# Patient Record
Sex: Female | Born: 1975 | Race: Black or African American | Hispanic: No | Marital: Married | Smoking: Never smoker
Health system: Southern US, Community
[De-identification: ages and names within clinical notes are randomized; demographics above are authoritative.]

## PROBLEM LIST (undated history)

## (undated) DIAGNOSIS — T7840XA Allergy, unspecified, initial encounter: Secondary | ICD-10-CM

## (undated) HISTORY — DX: Allergy, unspecified, initial encounter: T78.40XA

---

## 2010-02-21 ENCOUNTER — Emergency Department: Payer: Self-pay | Admitting: Emergency Medicine
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
## 2014-03-25 ENCOUNTER — Ambulatory Visit (INDEPENDENT_AMBULATORY_CARE_PROVIDER_SITE_OTHER): Payer: BC Managed Care – PPO

## 2014-03-25 ENCOUNTER — Ambulatory Visit (INDEPENDENT_AMBULATORY_CARE_PROVIDER_SITE_OTHER): Payer: BC Managed Care – PPO | Admitting: Podiatry

## 2014-03-25 ENCOUNTER — Other Ambulatory Visit: Payer: Self-pay | Admitting: *Deleted

## 2014-03-25 ENCOUNTER — Encounter: Payer: Self-pay | Admitting: Podiatry

## 2014-03-25 VITALS — BP 111/78 | HR 92 | Resp 16 | Ht 64.0 in | Wt 175.0 lb

## 2014-03-25 DIAGNOSIS — M722 Plantar fascial fibromatosis: Secondary | ICD-10-CM | POA: Diagnosis not present

## 2014-03-25 MED ORDER — METHYLPREDNISOLONE (PAK) 4 MG PO TABS: ORAL_TABLET | ORAL | Status: DC

## 2014-03-25 MED ORDER — MELOXICAM 15 MG PO TABS: 15.0000 mg | ORAL_TABLET | Freq: Every day | ORAL | Status: DC

## 2014-03-25 NOTE — Progress Notes (Signed)
   Subjective:    Patient ID: Shannon Pena, female    DOB: July 25, 1976, 38 y.o.   MRN: [id number]  HPI Comments: Both feet hurt, the right foot hurts on the side and the top of foot, the left foot the whole bottom of the foot hurts. As soon as my feet touch the ground in the mornings it is the worse. After being on them all day at work it hurts.   Foot Pain      Review of Systems  All other systems reviewed and are negative.      Objective:   Physical Exam: I have reviewed her past medical history medications allergies surgeries social history and review of systems. Pulses are strongly palpable bilateral. Neurologic sensorium is intact per Semmes-Weinstein monofilament. Deep tendon reflexes are intact bilateral muscle strength is 5 over 5 dorsiflexors plantar flexors inverters everters all intrinsic musculature is intact. Orthopedic evaluation demonstrates pain on palpation medial continued tubercles of the bilateral foot and pain on palpation of the lateral limb of the fifth metatarsal area right foot. Radiographic evaluation demonstrates mild pes planus bilateral with soft tissue increase in density at the plantar fascial calcaneal insertion sites bilaterally.        Assessment & Plan:  Assessment: Pes planus with plantar fasciitis right greater than left.  Plan: Injected the bilateral heels today with Kenalog and local anesthetic. Started her on Medrol Dosepak to be followed by meloxicam. Plantar fascial brace was dispensed for bilateral foot. And a plantar fascial night splint for the right foot. We discussed appropriate shoe gear stretching exercises ice therapy shoe gear modifications. We discussed the etiology pathology conservative versus surgical therapies. I will followup with her in one month should she have questions or concerns she will notify us immediately.

## 2014-03-27 ENCOUNTER — Telehealth: Payer: Self-pay | Admitting: *Deleted

## 2014-03-27 NOTE — Telephone Encounter (Signed)
Message copied by Bing ReeGUNN, Shacara Cozine L on Wed Mar 27, 2014  4:24 PM ------      Message from: Pleas PatriciaURANT, ROCHELLE L      Created: Wed Mar 27, 2014  3:43 PM      Regarding: nurse call      Contact: [phone]       She is having heartburn from Medrol dospack, she would like to know if its okay to take her heartburn medicine. ------

## 2014-03-27 NOTE — Telephone Encounter (Signed)
Pt called left message stating that dr Al Corpushyatt had put her on a prednisone, and was two days into taking it and wanted to know if her toenails being sore was a side effect. Spoke with dr Al Corpushyatt and stated that he had never heard of that . Called patient back and left her a message to call us back

## 2014-03-27 NOTE — Telephone Encounter (Signed)
Yes you can take take your heartburn medicine

## 2014-04-23 ENCOUNTER — Telehealth: Payer: Self-pay | Admitting: Podiatry

## 2014-04-24 ENCOUNTER — Ambulatory Visit: Payer: BC Managed Care – PPO | Admitting: Podiatry

## 2014-04-26 DIAGNOSIS — E785 Hyperlipidemia, unspecified: Secondary | ICD-10-CM | POA: Insufficient documentation

## 2014-05-23 ENCOUNTER — Ambulatory Visit: Payer: Self-pay | Admitting: Family Medicine

## 2016-11-16 ENCOUNTER — Encounter: Payer: Self-pay | Admitting: Podiatry

## 2016-11-16 ENCOUNTER — Ambulatory Visit (INDEPENDENT_AMBULATORY_CARE_PROVIDER_SITE_OTHER): Payer: BC Managed Care – PPO | Admitting: Podiatry

## 2016-11-16 ENCOUNTER — Ambulatory Visit (INDEPENDENT_AMBULATORY_CARE_PROVIDER_SITE_OTHER): Payer: BC Managed Care – PPO

## 2016-11-16 DIAGNOSIS — M79671 Pain in right foot: Secondary | ICD-10-CM

## 2016-11-16 DIAGNOSIS — M722 Plantar fascial fibromatosis: Secondary | ICD-10-CM | POA: Diagnosis not present

## 2016-11-16 DIAGNOSIS — M79672 Pain in left foot: Secondary | ICD-10-CM | POA: Diagnosis not present

## 2016-11-16 DIAGNOSIS — M79673 Pain in unspecified foot: Secondary | ICD-10-CM | POA: Diagnosis not present

## 2016-11-18 MED ORDER — IBUPROFEN-FAMOTIDINE 800-26.6 MG PO TABS: ORAL_TABLET | ORAL | 2 refills | Status: AC

## 2016-12-01 MED ORDER — BETAMETHASONE SOD PHOS & ACET 6 (3-3) MG/ML IJ SUSP: 3.0000 mg | Freq: Once | INTRAMUSCULAR | Status: AC

## 2016-12-01 NOTE — Progress Notes (Signed)
   Subjective: Patient presents today for pain and tenderness in the feet bilaterally. Patient states the foot pain has been hurting for approximately 1 year now. Patient states that the right foot hurts worse than the left. She states is aching at night. Patient has used Motrin with slight relief.  Objective: Physical Exam General: The patient is alert and oriented x3 in no acute distress.  Dermatology: Skin is warm, dry and supple bilateral lower extremities. Negative for open lesions or macerations bilateral.   Vascular: Dorsalis Pedis and Posterior Tibial pulses palpable bilateral.  Capillary fill time is immediate to all digits.  Neurological: Epicritic and protective threshold intact bilateral.   Musculoskeletal: Tenderness to palpation at the medial calcaneal tubercale and through the insertion of the plantar fascia of the bilateral feet. All other joints range of motion within normal limits bilateral. Strength 5/5 in all groups bilateral.   Radiographic exam: Normal osseous mineralization. Joint spaces preserved. No fracture/dislocation/boney destruction. Calcaneal spur present with mild thickening of plantar fascia bilateral. No other soft tissue abnormalities or radiopaque foreign bodies.   Assessment: #1 plantar fasciitis bilateral feet #2 pain in bilateral feet  Plan of Care:  1. Patient evaluated. Xrays reviewed.   2. Injection of 0.5cc Celestone soluspan injected into the left heel 3. Instructed patient regarding therapies and modalities at home to alleviate symptoms.  4. Prescription for Duexis 5. Plantar fascial band(s) dispensed for bilateral plantar fasciitis. 6. Return to clinic in 4 weeks.    Felecia ShellingBrent M. Evans, DPM [facility]  Dr. Felecia ShellingBrent M. Evans, DPM    [address]                                        [place], [place] [address]                Office [phone]  Fax [phone]

## 2016-12-17 ENCOUNTER — Ambulatory Visit: Payer: BC Managed Care – PPO | Admitting: Podiatry

## 2016-12-31 ENCOUNTER — Ambulatory Visit (INDEPENDENT_AMBULATORY_CARE_PROVIDER_SITE_OTHER): Payer: BC Managed Care – PPO | Admitting: Podiatry

## 2016-12-31 ENCOUNTER — Encounter: Payer: Self-pay | Admitting: Podiatry

## 2016-12-31 DIAGNOSIS — M79671 Pain in right foot: Secondary | ICD-10-CM

## 2016-12-31 DIAGNOSIS — M79673 Pain in unspecified foot: Secondary | ICD-10-CM

## 2016-12-31 DIAGNOSIS — M722 Plantar fascial fibromatosis: Secondary | ICD-10-CM | POA: Diagnosis not present

## 2016-12-31 DIAGNOSIS — M79672 Pain in left foot: Secondary | ICD-10-CM

## 2016-12-31 MED ORDER — NONFORMULARY OR COMPOUNDED ITEM: 2 refills | Status: AC

## 2017-01-02 NOTE — Progress Notes (Signed)
   Subjective: Patient presents today for follow up for pain and tenderness in the feet bilaterally. She states her pain is unchanged and reports significant pain, especially while at work. She states the injection did not help and the Duexis is not providing any relief either. She states she does not want another injection.  Objective: Physical Exam General: The patient is alert and oriented x3 in no acute distress.  Dermatology: Skin is warm, dry and supple bilateral lower extremities. Negative for open lesions or macerations bilateral.   Vascular: Dorsalis Pedis and Posterior Tibial pulses palpable bilateral.  Capillary fill time is immediate to all digits.  Neurological: Epicritic and protective threshold intact bilateral.   Musculoskeletal: Tenderness to palpation at the medial calcaneal tubercale and through the insertion of the plantar fascia of the bilateral feet. All other joints range of motion within normal limits bilateral. Strength 5/5 in all groups bilateral.   Assessment: #1 plantar fasciitis bilateral feet #2 pain in bilateral feet  Plan of Care:  1. Patient evaluated.  2. Prescription for antiinflammatory pain cream to be dispensed from [facility]. 3. Order for physical therapy three times weekly for four weeks given. 4. Continue wearing plantar fascial braces 5. Continue Duexis  6. Patient cannot take oral Prednisone 7. Return to clinic in 4 weeks.   Felecia Shelling, DPM [facility]  Dr. Felecia Shelling, DPM    [address]                                        [place], [place] [address]                Office [phone]  Fax [phone]

## 2017-02-01 ENCOUNTER — Ambulatory Visit (INDEPENDENT_AMBULATORY_CARE_PROVIDER_SITE_OTHER): Payer: BC Managed Care – PPO | Admitting: Podiatry

## 2017-02-01 DIAGNOSIS — M722 Plantar fascial fibromatosis: Secondary | ICD-10-CM

## 2017-02-01 NOTE — Progress Notes (Signed)
   Subjective: Patient presents today for follow up for pain and tenderness in the feet bilaterally. She states her pain is a little better. She states physical therapy is helping. She reports a sore, throbbing spot on the left medial heel. She has been wearing the plantar fasciitis braces which also helped to alleviate her pain.  Objective: Physical Exam General: The patient is alert and oriented x3 in no acute distress.  Dermatology: Skin is warm, dry and supple bilateral lower extremities. Negative for open lesions or macerations bilateral.   Vascular: Dorsalis Pedis and Posterior Tibial pulses palpable bilateral.  Capillary fill time is immediate to all digits.  Neurological: Epicritic and protective threshold intact bilateral.   Musculoskeletal: Tenderness to palpation at the medial calcaneal tubercale and through the insertion of the plantar fascia of the bilateral feet. All other joints range of motion within normal limits bilateral. Strength 5/5 in all groups bilateral.   Assessment: #1 plantar fasciitis bilateral feet #2 pain in bilateral feet  Plan of Care:  1. Patient evaluated.  2. Continue physical therapy. 3. Recommended OTC insoles from [employer]. 4. Return to clinic when necessary.   Felecia ShellingBrent M. Evans, DPM [facility]  Dr. Felecia ShellingBrent M. Evans, DPM    [address]                                        [place], [place] [address]                Office [phone]  Fax [phone]

## 2020-03-25 DIAGNOSIS — K219 Gastro-esophageal reflux disease without esophagitis: Secondary | ICD-10-CM | POA: Insufficient documentation

## 2024-09-05 ENCOUNTER — Ambulatory Visit: Admission: EM | Admit: 2024-09-05 | Discharge: 2024-09-05 | Disposition: A | Discharge status: Home / Self Care

## 2024-09-05 DIAGNOSIS — M7918 Myalgia, other site: Secondary | ICD-10-CM

## 2024-09-05 DIAGNOSIS — M25511 Pain in right shoulder: Secondary | ICD-10-CM
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
## 2024-09-05 NOTE — Discharge Instructions (Signed)
BACK/SHOULDER PAIN: Stressed avoiding painful activities . RICE (REST, ICE, COMPRESSION, ELEVATION) guidelines reviewed. May alternate ice and heat. Consider use of muscle rubs, Salonpas patches, etc. Use medications as directed including muscle relaxers if prescribed. Take anti-inflammatory medications as prescribed or OTC NSAIDs/Tylenol.  F/u with PCP in 7-10 days for reexamination, and please feel free to call or return to the urgent care at any time for any questions or concerns you may have and we will be happy to help you!   BACK PAIN RED FLAGS: If the back pain acutely worsens or there are any red flag symptoms such as numbness/tingling, leg weakness, saddle anesthesia, or loss of bowel/bladder control, go immediately to the ER. Follow up with [place] as scheduled or sooner if the pain does not begin to resolve or if it worsens before the follow up
